# Patient Record
Sex: Female | Born: 1950 | Race: Black or African American | Hispanic: No | Marital: Married | Smoking: Former smoker
Health system: Southern US, Community
[De-identification: ages and names within clinical notes are randomized; demographics above are authoritative.]

## PROBLEM LIST (undated history)

## (undated) DIAGNOSIS — I10 Essential (primary) hypertension: Secondary | ICD-10-CM

## (undated) DIAGNOSIS — E785 Hyperlipidemia, unspecified: Secondary | ICD-10-CM

## (undated) DIAGNOSIS — F0390 Unspecified dementia without behavioral disturbance: Secondary | ICD-10-CM

## (undated) DIAGNOSIS — I639 Cerebral infarction, unspecified: Secondary | ICD-10-CM

## (undated) DIAGNOSIS — R1310 Dysphagia, unspecified: Secondary | ICD-10-CM

## (undated) HISTORY — DX: Unspecified dementia, unspecified severity, without behavioral disturbance, psychotic disturbance, mood disturbance, and anxiety: F03.90

## (undated) HISTORY — PX: NO PAST SURGERIES: SHX2092

## (undated) HISTORY — DX: Hyperlipidemia, unspecified: E78.5

## (undated) HISTORY — DX: Essential (primary) hypertension: I10

## (undated) HISTORY — DX: Cerebral infarction, unspecified: I63.9

---

## 2000-06-07 ENCOUNTER — Emergency Department (HOSPITAL_COMMUNITY): Admission: EM | Admit: 2000-06-07 | Discharge: 2000-06-07 | Payer: Self-pay | Admitting: Emergency Medicine

## 2001-07-09 ENCOUNTER — Other Ambulatory Visit: Admission: RE | Admit: 2001-07-09 | Discharge: 2001-07-09 | Payer: Self-pay | Admitting: Family Medicine

## 2001-07-23 ENCOUNTER — Ambulatory Visit (HOSPITAL_COMMUNITY): Admission: RE | Admit: 2001-07-23 | Discharge: 2001-07-23 | Payer: Self-pay | Admitting: Family Medicine

## 2002-01-20 ENCOUNTER — Encounter: Payer: Self-pay | Admitting: Emergency Medicine

## 2002-01-20 ENCOUNTER — Emergency Department (HOSPITAL_COMMUNITY): Admission: EM | Admit: 2002-01-20 | Discharge: 2002-01-20 | Payer: Self-pay | Admitting: *Deleted

## 2006-10-08 ENCOUNTER — Ambulatory Visit (HOSPITAL_COMMUNITY): Admission: RE | Admit: 2006-10-08 | Discharge: 2006-10-08 | Payer: Self-pay | Admitting: Family Medicine

## 2007-01-01 ENCOUNTER — Emergency Department (HOSPITAL_COMMUNITY): Admission: EM | Admit: 2007-01-01 | Discharge: 2007-01-01 | Payer: Self-pay | Admitting: Emergency Medicine

## 2007-04-04 ENCOUNTER — Emergency Department (HOSPITAL_COMMUNITY): Admission: EM | Admit: 2007-04-04 | Discharge: 2007-04-04 | Payer: Self-pay | Admitting: Emergency Medicine

## 2007-05-07 ENCOUNTER — Emergency Department (HOSPITAL_COMMUNITY): Admission: EM | Admit: 2007-05-07 | Discharge: 2007-05-08 | Payer: Self-pay | Admitting: Emergency Medicine

## 2008-02-11 ENCOUNTER — Emergency Department (HOSPITAL_COMMUNITY): Admission: EM | Admit: 2008-02-11 | Discharge: 2008-02-11 | Payer: Self-pay | Admitting: Emergency Medicine

## 2008-08-19 ENCOUNTER — Encounter: Admission: RE | Admit: 2008-08-19 | Discharge: 2008-08-19 | Payer: Self-pay | Admitting: Family Medicine

## 2008-11-28 ENCOUNTER — Encounter: Admission: RE | Admit: 2008-11-28 | Discharge: 2008-11-28 | Payer: Self-pay | Admitting: Family Medicine

## 2009-08-04 ENCOUNTER — Emergency Department (HOSPITAL_COMMUNITY): Admission: EM | Admit: 2009-08-04 | Discharge: 2009-08-04 | Payer: Self-pay | Admitting: Emergency Medicine

## 2010-05-18 ENCOUNTER — Encounter: Admission: RE | Admit: 2010-05-18 | Discharge: 2010-05-18 | Payer: Self-pay | Admitting: Family Medicine

## 2010-05-23 ENCOUNTER — Encounter
Admission: RE | Admit: 2010-05-23 | Discharge: 2010-07-11 | Payer: Self-pay | Source: Home / Self Care | Attending: Family Medicine | Admitting: Family Medicine

## 2010-08-11 ENCOUNTER — Encounter: Payer: Self-pay | Admitting: Family Medicine

## 2011-05-01 LAB — CBC
MCHC: 33.6
MCV: 82.5
Platelets: 257
RDW: 14.8 — ABNORMAL HIGH

## 2011-05-01 LAB — DIFFERENTIAL
Monocytes Relative: 7
Neutro Abs: 2.7
Neutrophils Relative %: 44

## 2011-05-09 LAB — URINALYSIS, ROUTINE W REFLEX MICROSCOPIC
Hgb urine dipstick: NEGATIVE
Ketones, ur: NEGATIVE
Nitrite: NEGATIVE
Protein, ur: NEGATIVE
Specific Gravity, Urine: 1.01
Urobilinogen, UA: 1

## 2011-05-09 LAB — URINE MICROSCOPIC-ADD ON

## 2011-05-09 LAB — OCCULT BLOOD X 1 CARD TO LAB, STOOL: Fecal Occult Bld: NEGATIVE

## 2011-05-09 LAB — BASIC METABOLIC PANEL
Calcium: 9.4
GFR calc non Af Amer: >60
Glucose, Bld: 104 — ABNORMAL HIGH
Potassium: 4.5
Sodium: 140

## 2011-05-09 LAB — CBC
HCT: 41.3
MCV: 80.9
Platelets: 282
RDW: 14.1 — ABNORMAL HIGH
WBC: 6.3

## 2012-03-03 ENCOUNTER — Ambulatory Visit: Payer: BC Managed Care – PPO

## 2012-03-10 ENCOUNTER — Ambulatory Visit: Payer: BC Managed Care – PPO | Attending: Urology

## 2012-03-10 DIAGNOSIS — IMO0001 Reserved for inherently not codable concepts without codable children: Secondary | ICD-10-CM | POA: Insufficient documentation

## 2012-03-10 DIAGNOSIS — I69920 Aphasia following unspecified cerebrovascular disease: Secondary | ICD-10-CM | POA: Insufficient documentation

## 2012-03-25 ENCOUNTER — Ambulatory Visit: Payer: BC Managed Care – PPO | Attending: Urology | Admitting: Speech Pathology

## 2012-03-25 DIAGNOSIS — I69920 Aphasia following unspecified cerebrovascular disease: Secondary | ICD-10-CM | POA: Insufficient documentation

## 2012-03-25 DIAGNOSIS — IMO0001 Reserved for inherently not codable concepts without codable children: Secondary | ICD-10-CM | POA: Insufficient documentation

## 2012-04-01 ENCOUNTER — Ambulatory Visit: Payer: BC Managed Care – PPO | Admitting: Speech Pathology

## 2012-04-07 ENCOUNTER — Ambulatory Visit: Payer: BC Managed Care – PPO

## 2012-04-10 ENCOUNTER — Ambulatory Visit: Payer: BC Managed Care – PPO | Admitting: Speech Pathology

## 2012-04-14 ENCOUNTER — Ambulatory Visit: Payer: BC Managed Care – PPO

## 2012-04-16 ENCOUNTER — Ambulatory Visit: Payer: BC Managed Care – PPO

## 2012-04-21 ENCOUNTER — Ambulatory Visit: Payer: BC Managed Care – PPO | Attending: Urology

## 2012-04-21 DIAGNOSIS — I69920 Aphasia following unspecified cerebrovascular disease: Secondary | ICD-10-CM | POA: Insufficient documentation

## 2012-04-21 DIAGNOSIS — IMO0001 Reserved for inherently not codable concepts without codable children: Secondary | ICD-10-CM | POA: Insufficient documentation

## 2012-04-23 ENCOUNTER — Ambulatory Visit: Payer: BC Managed Care – PPO

## 2012-04-28 ENCOUNTER — Ambulatory Visit: Payer: BC Managed Care – PPO

## 2012-04-30 ENCOUNTER — Ambulatory Visit: Payer: BC Managed Care – PPO

## 2012-05-04 ENCOUNTER — Encounter: Payer: BC Managed Care – PPO | Admitting: Speech Pathology

## 2012-05-06 ENCOUNTER — Ambulatory Visit: Payer: BC Managed Care – PPO | Admitting: Speech Pathology

## 2014-09-21 ENCOUNTER — Encounter: Payer: Self-pay | Admitting: Internal Medicine

## 2014-09-21 ENCOUNTER — Ambulatory Visit (INDEPENDENT_AMBULATORY_CARE_PROVIDER_SITE_OTHER): Payer: Medicare Other | Admitting: Internal Medicine

## 2014-09-21 VITALS — BP 177/86 | HR 86 | Temp 98.1°F | Ht 64.0 in | Wt 241.9 lb

## 2014-09-21 DIAGNOSIS — I10 Essential (primary) hypertension: Secondary | ICD-10-CM

## 2014-09-21 DIAGNOSIS — Z8673 Personal history of transient ischemic attack (TIA), and cerebral infarction without residual deficits: Secondary | ICD-10-CM

## 2014-09-21 DIAGNOSIS — I639 Cerebral infarction, unspecified: Secondary | ICD-10-CM | POA: Insufficient documentation

## 2014-09-21 DIAGNOSIS — Z1239 Encounter for other screening for malignant neoplasm of breast: Secondary | ICD-10-CM

## 2014-09-21 DIAGNOSIS — Z7689 Persons encountering health services in other specified circumstances: Secondary | ICD-10-CM | POA: Insufficient documentation

## 2014-09-21 DIAGNOSIS — Z808 Family history of malignant neoplasm of other organs or systems: Secondary | ICD-10-CM

## 2014-09-21 DIAGNOSIS — Z7189 Other specified counseling: Secondary | ICD-10-CM

## 2014-09-21 DIAGNOSIS — Z1231 Encounter for screening mammogram for malignant neoplasm of breast: Secondary | ICD-10-CM

## 2014-09-21 DIAGNOSIS — F0390 Unspecified dementia without behavioral disturbance: Secondary | ICD-10-CM | POA: Insufficient documentation

## 2014-09-21 DIAGNOSIS — Z809 Family history of malignant neoplasm, unspecified: Secondary | ICD-10-CM | POA: Insufficient documentation

## 2014-09-21 DIAGNOSIS — Z23 Encounter for immunization: Secondary | ICD-10-CM

## 2014-09-21 DIAGNOSIS — Z299 Encounter for prophylactic measures, unspecified: Secondary | ICD-10-CM

## 2014-09-21 DIAGNOSIS — R739 Hyperglycemia, unspecified: Secondary | ICD-10-CM

## 2014-09-21 DIAGNOSIS — Z418 Encounter for other procedures for purposes other than remedying health state: Secondary | ICD-10-CM

## 2014-09-21 DIAGNOSIS — R7309 Other abnormal glucose: Secondary | ICD-10-CM

## 2014-09-21 LAB — CBC
HCT: 42.9 % (ref 36.0–46.0)
HEMOGLOBIN: 14.2 g/dL (ref 12.0–15.0)
MCH: 26.1 pg (ref 26.0–34.0)
MCHC: 33.1 g/dL (ref 30.0–36.0)
MCV: 78.9 fL (ref 78.0–100.0)
MPV: 9.4 fL (ref 8.6–12.4)
PLATELETS: 295 10*3/uL (ref 150–400)
RBC: 5.44 MIL/uL — AB (ref 3.87–5.11)
RDW: 16 % — ABNORMAL HIGH (ref 11.5–15.5)
WBC: 6.8 10*3/uL (ref 4.0–10.5)

## 2014-09-21 LAB — T4, FREE: Free T4: 1.14 ng/dL (ref 0.80–1.80)

## 2014-09-21 LAB — GLUCOSE, CAPILLARY: GLUCOSE-CAPILLARY: 89 mg/dL (ref 70–99)

## 2014-09-21 LAB — POCT GLYCOSYLATED HEMOGLOBIN (HGB A1C): Hemoglobin A1C: 5.7

## 2014-09-21 LAB — TSH: TSH: 0.976 u[IU]/mL (ref 0.350–4.500)

## 2014-09-21 MED ORDER — ASPIRIN EC 81 MG PO TBEC: 81.0000 mg | DELAYED_RELEASE_TABLET | Freq: Every day | ORAL | Status: DC

## 2014-09-21 MED ORDER — ATORVASTATIN CALCIUM 20 MG PO TABS: 20.0000 mg | ORAL_TABLET | Freq: Every day | ORAL | Status: DC

## 2014-09-21 MED ORDER — HYDROCHLOROTHIAZIDE 25 MG PO TABS: 25.0000 mg | ORAL_TABLET | Freq: Every day | ORAL | Status: DC

## 2014-09-21 NOTE — Assessment & Plan Note (Addendum)
Per daughter, [facility] h/o and dementia in the past, maybe around '12. Per her daughter, she was on some medication for dementia, but we have no records of what the medication was and the family is unsure. On exam, she seems to have normal physical strength, but she has significant difficulty with cognition and following commands. Speech is nonfluent, and she seems unable to comprehend verbally or in writing or write out what she is thinking- global aphasia. The question is why, especially this young. Differential includes: new CVA, possible that the pituitary prominence has expanded, although she would likely have vision changes/loss, which does not seem to be the case. Normal pressure hdrocephalous could present with cognitive changes, but those changes are generally depressive symptoms and aphasia is thought to be less common, and would also expect gait abnormality, which she does not have. Depression can have neurological manifestations, but she does not appear depressed. Will check TSH and free T4 as thyroid dysfunction can have neurologic manifestations. Checking CMP to assess for other causes of her symptoms. She may be drinking, which can also result in alcohol induced dementia, but the daughter is unsure if her mother is drinking. Possible that she has advancing Alzheimer's disease, which is the source for her symptoms. Doubt dementia medication such as Aricept or Namenda would be beneficial if this is Alzheimer's given the advanced nature. However, Alzheimer's disease at such a young age is atypical and is generally genetic, and the patient has no know family history of the disease. With her global aphasia, encephalitis is a possibilty but less likely given the duration of her symptoms vs advanced degenerative dementia.  - [facility] - Referring to Neurology --> family requests to see different provider - Checking  CMP, CBC, TSH, free T4 - Will need to check B12 at next visit.  - Repeat MRI brain and pituitary as recommended in '11.

## 2014-09-21 NOTE — Assessment & Plan Note (Signed)
Mildly elevated serum glucose in the past. Checking A1c in the setting of possible stroke history and cognitive deficits.

## 2014-09-21 NOTE — Assessment & Plan Note (Addendum)
Questionable h/o CVA. Per daughter, family kept in the dark by the patient until she began to develop speaking difficulties in '12. MRI brain from 10/11 w/o mention of acute or remote infarct. [facility] her h/o CVA and possible demential, per daughter.  - [facility]  - Checking Lipid panel, A1c - Starting ASA 81mg  daily - Starting Lipitor 20mg  daily which should provide antiinflammatory benefit in addition to cholesterol benefits.

## 2014-09-21 NOTE — Progress Notes (Signed)
Internal Medicine Clinic Attending  Case discussed with Dr. Glenn soon after the resident saw the patient.  We reviewed the resident's history and exam and pertinent patient test results.  I agree with the assessment, diagnosis, and plan of care documented in the resident's note. 

## 2014-09-21 NOTE — Assessment & Plan Note (Signed)
Per daughter, pt with h/o HTN but she has not been seen by an MD in a number of years and is not on medication. Last labs were from '08 with normal electrolytes and renal function.  - Checking CMP  - Starting HCTZ 25mg  daily - F/u in 1 mo and will check BMP to assess renal function and electrolytes on the diuretic.

## 2014-09-21 NOTE — Progress Notes (Signed)
Patient ID: Amy Orr, female   DOB: 1951-01-02, 64 y.o.   MRN: [id number]  Subjective:   Patient ID: Amy Orr female   DOB: 1951-01-02 64 y.o.   MRN: [id number]  HPI: Ms.Amy Orr is a 64 y.o. 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   Her daughter is present with her today and is providing the medical history. Per the daughter, the pt had a stroke around 2010 or 2011, which the daughter states was not revealed to the family until around 2012 when her speech began to worsen and she started having comprehension problems. Per her daughter, she is now having trouble comprehending written words. Brain MRI from 04/2010 was ordered due to slurred speech and loss of coordination. Results without mention of stroke but mild atrophy and small vessel disease was noted. 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  She has seen a Neurologist since this all occurred and was diagnosed with some form of dementia and was placed on some type of dementia medication per daughter.   PMH: HTN CVA ?Dementia  SH: Tobacco abuse- previous smoker 20+ys ago, unknown amount, thought to be occasional use Possible EtOH use, pt nods yes but unable to quantify amount  FH: M- HTN, HLD D- unknown Siblings (10)- CVA, HTN, sister with breast ca Daughter- asthma, allergies Son- schizophrenia    No past medical history on file. No current outpatient prescriptions on file.   No current facility-administered medications for this visit.   No family history on file. History   Social History  . Marital Status: Married    Spouse Name: N/A  . Number of Children: N/A  . Years of Education: N/A   Social History Main Topics  . Smoking status: Former Games developermoker  . Smokeless tobacco: Not on file  . Alcohol Use: Not on file  . Drug Use: Not on file  . Sexual Activity: Not on file   Other Topics Concern  . None    Social History Narrative  . None   Review of Systems: ROS difficult to obtain due to the patient not speaking or able to write. A 12 point ROS was performed, and the only question she answered yes to was for headaches, that are over her right eye. Otherwise ROS was negative.   Objective:  Physical Exam: Filed Vitals:   09/21/14 1540  BP: 177/86  Pulse: 86  Temp: 98.1 F (36.7 C)  TempSrc: Oral  Height: 5\' 4"  (1.626 m)  Weight: 241 lb 14.4 oz (109.725 kg)  SpO2: 98%   Constitutional: Vital signs reviewed.  Patient is a well-developed and well-nourished female in no acute distress and cooperative with exam.  Head: Normocephalic and atraumatic Face: Hirsutism under chin Mouth: MMM Eyes: PERRL, EOMI appear intact, pt unable to follow commands.   Neck: Trachea midline, normal ROM, thyroid not enlarged Cardiovascular: RRR, no MRG, pulses symmetric and intact bilaterally Pulmonary/Chest: Sounds clear, poor effort Breast: Deferred Abdominal: Soft. Non-tender, non-distended, bowel sounds are normal, no masses, organomegaly, or guarding present.  GU: no CVA tenderness. Deferred vaginal exam. Musculoskeletal: No joint deformities, erythema, or stiffness, ROM full. Neurological: Pt alert. Unable to assess orientation since she is not speaking. Pt not able to follow commands except to stand and sit on and get off the exam table. Does not appear to have facial weakness or droop, unable to assess other CN or obtain adequate neurological exam.  Skin:  Warm, dry and intact.   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   Assessment & Plan:   Please refer to Problem List based Assessment and Plan

## 2014-09-21 NOTE — Patient Instructions (Addendum)
I will refer you to Neurology  Begin taking a daily 81mg  Aspirin, Lipitor 20mg  daily for cholesterol, and HCTZ 25mg  daily for your blood pressure.  I will put referrals in to GYN for a complete female exam, to GI for a colonoscopy, and a referral for a mammogram. You will be contacted regarding scheduling.   General Instructions:   Please bring your medicines with you each time you come to clinic.  Medicines may include prescription medications, over-the-counter medications, herbal remedies, eye drops, vitamins, or other pills.      Hydrochlorothiazide, HCTZ capsules or tablets What is this medicine? HYDROCHLOROTHIAZIDE (hye droe klor oh THYE a zide) is a diuretic. It increases the amount of urine passed, which causes the body to lose salt and water. This medicine is used to treat high blood pressure. It is also reduces the swelling and water retention caused by various medical conditions, such as heart, liver, or kidney disease. This medicine may be used for other purposes; ask your health care provider or pharmacist if you have questions. COMMON BRAND NAME(S): Esidrix, Ezide, HydroDIURIL, Microzide, Oretic, Zide What should I tell my health care provider before I take this medicine? They need to know if you have any of these conditions: -diabetes -gout -immune system problems, like lupus -kidney disease or kidney stones -liver disease -pancreatitis -small amount of urine or difficulty passing urine -an unusual or allergic reaction to hydrochlorothiazide, sulfa drugs, other medicines, foods, dyes, or preservatives -pregnant or trying to get pregnant -breast-feeding How should I use this medicine? Take this medicine by mouth with a glass of water. Follow the directions on the prescription label. Take your medicine at regular intervals. Remember that you will need to pass urine frequently after taking this medicine. Do not take your doses at a time of day that will cause you problems.  Do not stop taking your medicine unless your doctor tells you to. Talk to your pediatrician regarding the use of this medicine in children. Special care may be needed. Overdosage: If you think you have taken too much of this medicine contact a poison control center or emergency room at once. NOTE: This medicine is only for you. Do not share this medicine with others. What if I miss a dose? If you miss a dose, take it as soon as you can. If it is almost time for your next dose, take only that dose. Do not take double or extra doses. What may interact with this medicine? -cholestyramine -colestipol -digoxin -dofetilide -lithium -medicines for blood pressure -medicines for diabetes -medicines that relax muscles for surgery -other diuretics -steroid medicines like prednisone or cortisone This list may not describe all possible interactions. Give your health care provider a list of all the medicines, herbs, non-prescription drugs, or dietary supplements you use. Also tell them if you smoke, drink alcohol, or use illegal drugs. Some items may interact with your medicine. What should I watch for while using this medicine? Visit your doctor or health care professional for regular checks on your progress. Check your blood pressure as directed. Ask your doctor or health care professional what your blood pressure should be and when you should contact him or her. You may need to be on a special diet while taking this medicine. Ask your doctor. Check with your doctor or health care professional if you get an attack of severe diarrhea, nausea and vomiting, or if you sweat a lot. The loss of too much body fluid can make it dangerous for you  to take this medicine. You may get drowsy or dizzy. Do not drive, use machinery, or do anything that needs mental alertness until you know how this medicine affects you. Do not stand or sit up quickly, especially if you are an older patient. This reduces the risk of dizzy  or fainting spells. Alcohol may interfere with the effect of this medicine. Avoid alcoholic drinks. This medicine may affect your blood sugar level. If you have diabetes, check with your doctor or health care professional before changing the dose of your diabetic medicine. This medicine can make you more sensitive to the sun. Keep out of the sun. If you cannot avoid being in the sun, wear protective clothing and use sunscreen. Do not use sun lamps or tanning beds/booths. What side effects may I notice from receiving this medicine? Side effects that you should report to your doctor or health care professional as soon as possible: -allergic reactions such as skin rash or itching, hives, swelling of the lips, mouth, tongue, or throat -changes in vision -chest pain -eye pain -fast or irregular heartbeat -feeling faint or lightheaded, falls -gout attack -muscle pain or cramps -pain or difficulty when passing urine -pain, tingling, numbness in the hands or feet -redness, blistering, peeling or loosening of the skin, including inside the mouth -unusually weak or tired Side effects that usually do not require medical attention (report to your doctor or health care professional if they continue or are bothersome): -change in sex drive or performance -dry mouth -headache -stomach upset This list may not describe all possible side effects. Call your doctor for medical advice about side effects. You may report side effects to FDA at 1-800-FDA-1088. Where should I keep my medicine? Keep out of the reach of children. Store at room temperature between 15 and 30 degrees C (59 and 86 degrees F). Do not freeze. Protect from light and moisture. Keep container closed tightly. Throw away any unused medicine after the expiration date. NOTE: This sheet is a summary. It may not cover all possible information. If you have questions about this medicine, talk to your doctor, pharmacist, or health care provider.   2015, Elsevier/Gold Standard. (2010-03-02 12:57:37) Atorvastatin tablets What is this medicine? ATORVASTATIN (a TORE va sta tin) is known as a HMG-CoA reductase inhibitor or 'statin'. It lowers the level of cholesterol and triglycerides in the blood. This drug may also reduce the risk of heart attack, stroke, or other health problems in patients with risk factors for heart disease. Diet and lifestyle changes are often used with this drug. This medicine may be used for other purposes; ask your health care provider or pharmacist if you have questions. COMMON BRAND NAME(S): Lipitor What should I tell my health care provider before I take this medicine? They need to know if you have any of these conditions: -frequently drink alcoholic beverages -history of stroke, TIA -kidney disease -liver disease -muscle aches or weakness -other medical condition -an unusual or allergic reaction to atorvastatin, other medicines, foods, dyes, or preservatives -pregnant or trying to get pregnant -breast-feeding How should I use this medicine? Take this medicine by mouth with a glass of water. Follow the directions on the prescription label. You can take this medicine with or without food. Take your doses at regular intervals. Do not take your medicine more often than directed. Talk to your pediatrician regarding the use of this medicine in children. While this drug may be prescribed for children as young as 59 years old for selected conditions,  precautions do apply. Overdosage: If you think you have taken too much of this medicine contact a poison control center or emergency room at once. NOTE: This medicine is only for you. Do not share this medicine with others. What if I miss a dose? If you miss a dose, take it as soon as you can. If it is almost time for your next dose, take only that dose. Do not take double or extra doses. What may interact with this medicine? Do not take this medicine with any of the  following medications: -red yeast rice -telaprevir -telithromycin -voriconazole This medicine may also interact with the following medications: -alcohol -antiviral medicines for HIV or AIDS -boceprevir -certain antibiotics like clarithromycin, erythromycin, troleandomycin -certain medicines for cholesterol like fenofibrate or gemfibrozil -cimetidine -clarithromycin -colchicine -cyclosporine -digoxin -female hormones, like estrogens or progestins and birth control pills -grapefruit juice -medicines for fungal infections like fluconazole, itraconazole, ketoconazole -niacin -rifampin -spironolactone This list may not describe all possible interactions. Give your health care provider a list of all the medicines, herbs, non-prescription drugs, or dietary supplements you use. Also tell them if you smoke, drink alcohol, or use illegal drugs. Some items may interact with your medicine. What should I watch for while using this medicine? Visit your doctor or health care professional for regular check-ups. You may need regular tests to make sure your liver is working properly. Tell your doctor or health care professional right away if you get any unexplained muscle pain, tenderness, or weakness, especially if you also have a fever and tiredness. Your doctor or health care professional may tell you to stop taking this medicine if you develop muscle problems. If your muscle problems do not go away after stopping this medicine, contact your health care professional. This drug is only part of a total heart-health program. Your doctor or a dietician can suggest a low-cholesterol and low-fat diet to help. Avoid alcohol and smoking, and keep a proper exercise schedule. Do not use this drug if you are pregnant or breast-feeding. Serious side effects to an unborn child or to an infant are possible. Talk to your doctor or pharmacist for more information. This medicine may affect blood sugar levels. If you  have diabetes, check with your doctor or health care professional before you change your diet or the dose of your diabetic medicine. If you are going to have surgery tell your health care professional that you are taking this drug. What side effects may I notice from receiving this medicine? Side effects that you should report to your doctor or health care professional as soon as possible: -allergic reactions like skin rash, itching or hives, swelling of the face, lips, or tongue -dark urine -fever -joint pain -muscle cramps, pain -redness, blistering, peeling or loosening of the skin, including inside the mouth -trouble passing urine or change in the amount of urine -unusually weak or tired -yellowing of eyes or skin Side effects that usually do not require medical attention (report to your doctor or health care professional if they continue or are bothersome): -constipation -heartburn -stomach gas, pain, upset This list may not describe all possible side effects. Call your doctor for medical advice about side effects. You may report side effects to FDA at 1-800-FDA-1088. Where should I keep my medicine? Keep out of the reach of children. Store at room temperature between 20 to 25 degrees C (68 to 77 degrees F). Throw away any unused medicine after the expiration date. NOTE: This sheet is a summary. It  may not cover all possible information. If you have questions about this medicine, talk to your doctor, pharmacist, or health care provider.  2015, Elsevier/Gold Standard. (2011-05-28 40:98:1109:18:24)

## 2014-09-21 NOTE — Assessment & Plan Note (Signed)
Last labs were from '08, checking CBC, CMP, lipid panel, A1c.  Pt due for health maintenance items: referral placed to GYN for female exam, GI for colonoscopy, and for mammogram. Flu vaccine given in clinic today.  - Pt due for Tdap, shingles vaccine, and HIV screening, which can be addressed at her next clinic visit.

## 2014-09-22 LAB — LIPID PANEL
CHOLESTEROL: 218 mg/dL — AB (ref 0–200)
HDL: 69 mg/dL (ref >=46)
LDL CALC: 135 mg/dL — AB (ref 0–99)
TRIGLYCERIDES: 72 mg/dL (ref <150)
Total CHOL/HDL Ratio: 3.2 Ratio
VLDL: 14 mg/dL (ref 0–40)

## 2014-09-22 LAB — COMPLETE METABOLIC PANEL WITH GFR
ALBUMIN: 4.2 g/dL (ref 3.5–5.2)
ALT: 18 U/L (ref 0–35)
AST: 17 U/L (ref 0–37)
Alkaline Phosphatase: 82 U/L (ref 39–117)
BILIRUBIN TOTAL: 0.4 mg/dL (ref 0.2–1.2)
BUN: 6 mg/dL (ref 6–23)
CO2: 23 meq/L (ref 19–32)
Calcium: 9.2 mg/dL (ref 8.4–10.5)
Chloride: 107 mEq/L (ref 96–112)
Creat: 0.73 mg/dL (ref 0.50–1.10)
GFR, Est Non African American: 89 mL/min
Glucose, Bld: 88 mg/dL (ref 70–99)
POTASSIUM: 3.8 meq/L (ref 3.5–5.3)
SODIUM: 141 meq/L (ref 135–145)
TOTAL PROTEIN: 7.2 g/dL (ref 6.0–8.3)

## 2014-09-22 NOTE — Addendum Note (Signed)
Addended by: Genelle GatherGLENN, Lenus Trauger F on: 09/22/2014 09:11 AM   Modules accepted: Orders

## 2014-09-22 NOTE — Progress Notes (Signed)
Talked with daughter Dennie Bible- [place] [phone] - aware MRI [facility] [address] 09/28/14 7PM - arrive 6:45PM. Christabelle Hanzlik RN 09/22/14 9:20AM

## 2014-09-28 ENCOUNTER — Ambulatory Visit (HOSPITAL_COMMUNITY)
Admission: RE | Admit: 2014-09-28 | Discharge: 2014-09-28 | Disposition: A | Discharge status: Home / Self Care | Payer: Medicare Other | Source: Ambulatory Visit | Attending: Internal Medicine | Admitting: Internal Medicine

## 2014-09-28 DIAGNOSIS — F0390 Unspecified dementia without behavioral disturbance: Secondary | ICD-10-CM

## 2014-09-28 DIAGNOSIS — R410 Disorientation, unspecified: Secondary | ICD-10-CM | POA: Insufficient documentation

## 2014-09-28 DIAGNOSIS — R55 Syncope and collapse: Secondary | ICD-10-CM | POA: Diagnosis not present

## 2014-09-28 DIAGNOSIS — R251 Tremor, unspecified: Secondary | ICD-10-CM | POA: Diagnosis not present

## 2014-09-28 DIAGNOSIS — R479 Unspecified speech disturbances: Secondary | ICD-10-CM | POA: Insufficient documentation

## 2014-09-30 ENCOUNTER — Telehealth: Payer: Self-pay | Admitting: *Deleted

## 2014-09-30 NOTE — Telephone Encounter (Signed)
Pts daughter called about results MRI - aware Dr Sherrine MaplesGlenn unable to call till 10/03/14 - was told no new findings or changes per Dr Sherrine MaplesGlenn. Stanton KidneyDebra Ecko Beasley RN 09/30/14 3PM

## 2014-10-06 ENCOUNTER — Ambulatory Visit (HOSPITAL_COMMUNITY): Admission: RE | Admit: 2014-10-06 | Payer: Medicare Other | Source: Ambulatory Visit

## 2014-10-18 ENCOUNTER — Encounter: Payer: Self-pay | Admitting: *Deleted

## 2014-10-20 ENCOUNTER — Encounter: Payer: Self-pay | Admitting: Internal Medicine

## 2014-10-20 ENCOUNTER — Encounter: Payer: Medicare Other | Admitting: Obstetrics & Gynecology

## 2014-10-26 ENCOUNTER — Encounter: Payer: Medicare Other | Admitting: Obstetrics & Gynecology

## 2014-10-31 NOTE — Addendum Note (Signed)
Addended by: Neomia DearPOWERS, Telford Archambeau E on: 10/31/2014 06:34 PM   Modules accepted: Orders

## 2014-11-10 ENCOUNTER — Ambulatory Visit: Payer: Medicare Other | Admitting: Internal Medicine

## 2014-11-21 NOTE — Addendum Note (Signed)
Addended by: Neomia DearPOWERS, Pinkey Mcjunkin E on: 11/21/2014 06:29 PM   Modules accepted: Orders

## 2015-01-12 ENCOUNTER — Emergency Department (HOSPITAL_COMMUNITY): Payer: Medicare Other

## 2015-01-12 ENCOUNTER — Emergency Department (HOSPITAL_COMMUNITY)
Admission: EM | Admit: 2015-01-12 | Discharge: 2015-01-12 | Disposition: A | Discharge status: Home / Self Care | Payer: Medicare Other | Attending: Emergency Medicine | Admitting: Emergency Medicine

## 2015-01-12 ENCOUNTER — Encounter (HOSPITAL_COMMUNITY): Payer: Self-pay | Admitting: Emergency Medicine

## 2015-01-12 ENCOUNTER — Telehealth: Payer: Self-pay | Admitting: *Deleted

## 2015-01-12 DIAGNOSIS — Z8673 Personal history of transient ischemic attack (TIA), and cerebral infarction without residual deficits: Secondary | ICD-10-CM | POA: Insufficient documentation

## 2015-01-12 DIAGNOSIS — I10 Essential (primary) hypertension: Secondary | ICD-10-CM | POA: Diagnosis not present

## 2015-01-12 DIAGNOSIS — R4182 Altered mental status, unspecified: Secondary | ICD-10-CM | POA: Diagnosis present

## 2015-01-12 DIAGNOSIS — Z79899 Other long term (current) drug therapy: Secondary | ICD-10-CM | POA: Diagnosis not present

## 2015-01-12 DIAGNOSIS — Z87891 Personal history of nicotine dependence: Secondary | ICD-10-CM | POA: Insufficient documentation

## 2015-01-12 DIAGNOSIS — F0390 Unspecified dementia without behavioral disturbance: Secondary | ICD-10-CM | POA: Insufficient documentation

## 2015-01-12 LAB — CBC WITH DIFFERENTIAL/PLATELET
BASOS ABS: 0 10*3/uL (ref 0.0–0.1)
BASOS PCT: 0 % (ref 0–1)
Eosinophils Absolute: 0 10*3/uL (ref 0.0–0.7)
Eosinophils Relative: 0 % (ref 0–5)
HCT: 39.1 % (ref 36.0–46.0)
HEMOGLOBIN: 13 g/dL (ref 12.0–15.0)
LYMPHS PCT: 24 % (ref 12–46)
Lymphs Abs: 1.5 10*3/uL (ref 0.7–4.0)
MCH: 26.7 pg (ref 26.0–34.0)
MCHC: 33.2 g/dL (ref 30.0–36.0)
MCV: 80.3 fL (ref 78.0–100.0)
MONO ABS: 0.7 10*3/uL (ref 0.1–1.0)
Monocytes Relative: 11 % (ref 3–12)
NEUTROS ABS: 4 10*3/uL (ref 1.7–7.7)
NEUTROS PCT: 65 % (ref 43–77)
PLATELETS: 257 10*3/uL (ref 150–400)
RBC: 4.87 MIL/uL (ref 3.87–5.11)
RDW: 15.7 % — AB (ref 11.5–15.5)
WBC: 6.2 10*3/uL (ref 4.0–10.5)

## 2015-01-12 LAB — URINALYSIS, ROUTINE W REFLEX MICROSCOPIC
GLUCOSE, UA: NEGATIVE mg/dL
HGB URINE DIPSTICK: NEGATIVE
Ketones, ur: 40 mg/dL — AB
Leukocytes, UA: NEGATIVE
Nitrite: NEGATIVE
PROTEIN: NEGATIVE mg/dL
Specific Gravity, Urine: 1.029 (ref 1.005–1.030)
UROBILINOGEN UA: 1 mg/dL (ref 0.0–1.0)
pH: 6 (ref 5.0–8.0)

## 2015-01-12 LAB — COMPREHENSIVE METABOLIC PANEL
ALBUMIN: 3.7 g/dL (ref 3.5–5.0)
ALT: 54 U/L (ref 14–54)
AST: 64 U/L — AB (ref 15–41)
Alkaline Phosphatase: 68 U/L (ref 38–126)
Anion gap: 8 (ref 5–15)
CALCIUM: 9.1 mg/dL (ref 8.9–10.3)
CO2: 25 mmol/L (ref 22–32)
Chloride: 108 mmol/L (ref 101–111)
Creatinine, Ser: 0.86 mg/dL (ref 0.44–1.00)
GFR calc Af Amer: >60 mL/min (ref >60)
GFR calc non Af Amer: >60 mL/min (ref >60)
GLUCOSE: 127 mg/dL — AB (ref 65–99)
Potassium: 3.1 mmol/L — ABNORMAL LOW (ref 3.5–5.1)
SODIUM: 141 mmol/L (ref 135–145)
Total Bilirubin: 0.4 mg/dL (ref 0.3–1.2)
Total Protein: 7.2 g/dL (ref 6.5–8.1)

## 2015-01-12 NOTE — ED Notes (Signed)
Pt arrived from home with husband and son by [facility] with c/o increased confusion and repetitive movements. Pts husband is currently admitted to the hospital. Daughter is at bedside stated that pt has dementia and does not talk normally since pt had a stroke 6 years ago. Pt started having repetitive movements that are not normal for her such as filling up a bucket of water and pouring it into the toilet over and over again. Pt also kept playing an ad on her tablet over and over again as well. Pt husband told the daughter recently that pt has been having increase frequency of urination as well. BP-150/85 HR-90 NSR Resp-18 O2sat-99%ra CBG-87

## 2015-01-12 NOTE — Discharge Instructions (Signed)
She may be a little dehydrated. Encourage oral intake.  Dementia Dementia is a general term for problems with brain function. A person with dementia has memory loss and a hard time with at least one other brain function such as thinking, speaking, or problem solving. Dementia can affect social functioning, how you do your job, your mood, or your personality. The changes may be hidden for a long time. The earliest forms of this disease are usually not detected by family or friends. Dementia can be:  Irreversible.  Potentially reversible.  Partially reversible.  Progressive. This means it can get worse over time. CAUSES  Irreversible dementia causes may include:  Degeneration of brain cells (Alzheimer disease or Lewy body dementia).  Multiple small strokes (vascular dementia).  Infection (chronic meningitis or Creutzfeldt-Jakob disease).  Frontotemporal dementia. This affects younger people, age 31 to 37, compared to those who have Alzheimer disease.  Dementia associated with other disorders like Parkinson disease, Huntington disease, or HIV-associated dementia. Potentially or partially reversible dementia causes may include:  Medicines.  Metabolic causes such as excessive alcohol intake, vitamin B12 deficiency, or thyroid disease.  Masses or pressure in the brain such as a tumor, blood clot, or hydrocephalus. SIGNS AND SYMPTOMS  Symptoms are often hard to detect. Family members or coworkers may not notice them early in the disease process. Different people with dementia may have different symptoms. Symptoms can include:  A hard time with memory, especially recent memory. Long-term memory may not be impaired.  Asking the same question multiple times or forgetting something someone just said.  A hard time speaking your thoughts or finding certain words.  A hard time solving problems or performing familiar tasks (such as how to use a telephone).  Sudden changes in  mood.  Changes in personality, especially increasing moodiness or mistrust.  Depression.  A hard time understanding complex ideas that were never a problem in the past. DIAGNOSIS  There are no specific tests for dementia.   Your health care provider may recommend a thorough evaluation. This is because some forms of dementia can be reversible. The evaluation will likely include a physical exam and getting a detailed history from you and a family member. The history often gives the best clues and suggestions for a diagnosis.  Memory testing may be done. A detailed brain function evaluation called neuropsychologic testing may be helpful.  Lab tests and brain imaging (such as a CT scan or MRI scan) are sometimes important.  Sometimes observation and re-evaluation over time is very helpful. TREATMENT  Treatment depends on the cause.   If the problem is a vitamin deficiency, it may be helped or cured with supplements.  For dementias such as Alzheimer disease, medicines are available to stabilize or slow the course of the disease. There are no cures for this type of dementia.  Your health care provider can help direct you to groups, organizations, and other health care providers to help with decisions in the care of you or your loved one. HOME CARE INSTRUCTIONS The care of individuals with dementia is varied and dependent upon the progression of the dementia. The following suggestions are intended for the person living with, or caring for, the person with dementia.  Create a safe environment.  Remove the locks on bathroom doors to prevent the person from accidentally locking himself or herself in.  Use childproof latches on kitchen cabinets and any place where cleaning supplies, chemicals, or alcohol are kept.  Use childproof covers in unused electrical  outlets.  Install childproof devices to keep doors and windows secured.  Remove stove knobs or install safety knobs and an automatic  shut-off on the stove.  Lower the temperature on water heaters.  Label medicines and keep them locked up.  Secure knives, lighters, matches, power tools, and guns, and keep these items out of reach.  Keep the house free from clutter. Remove rugs or anything that might contribute to a fall.  Remove objects that might break and hurt the person.  Make sure lighting is good, both inside and outside.  Install grab rails as needed.  Use a monitoring device to alert you to falls or other needs for help.  Reduce confusion.  Keep familiar objects and people around.  Use night lights or dim lights at night.  Label items or areas.  Use reminders, notes, or directions for daily activities or tasks.  Keep a simple, consistent routine for waking, meals, bathing, dressing, and bedtime.  Create a calm, quiet environment.  Place large clocks and calendars prominently.  Display emergency numbers and home address near all telephones.  Use cues to establish different times of the day. An example is to open curtains to let the natural light in during the day.   Use effective communication.  Choose simple words and short sentences.  Use a gentle, calm tone of voice.  Be careful not to interrupt.  If the person is struggling to find a word or communicate a thought, try to provide the word or thought.  Ask one question at a time. Allow the person ample time to answer questions. Repeat the question again if the person does not respond.  Reduce nighttime restlessness.  Provide a comfortable bed.  Have a consistent nighttime routine.  Ensure a regular walking or physical activity schedule. Involve the person in daily activities as much as possible.  Limit napping during the day.  Limit caffeine.  Attend social events that stimulate rather than overwhelm the senses.  Encourage good nutrition and hydration.  Reduce distractions during meal times and snacks.  Avoid foods that  are too hot or too cold.  Monitor chewing and swallowing ability.  Continue with routine vision, hearing, dental, and medical screenings.  Give medicines only as directed by the health care provider.  Monitor driving abilities. Do not allow the person to drive when safe driving is no longer possible.  Register with an identification program which could provide location assistance in the event of a missing person situation. SEEK MEDICAL CARE IF:   New behavioral problems start such as moodiness, aggressiveness, or seeing things that are not there (hallucinations).  Any new problem with brain function happens. This includes problems with balance, speech, or falling a lot.  Problems with swallowing develop.  Any symptoms of other illness happen. Small changes or worsening in any aspect of brain function can be a sign that the illness is getting worse. It can also be a sign of another medical illness such as infection. Seeing a health care provider right away is important. SEEK IMMEDIATE MEDICAL CARE IF:   A fever develops.  New or worsened confusion develops.  New or worsened sleepiness develops.  Staying awake becomes hard to do. Document Released: 01/01/2001 Document Revised: 11/22/2013 Document Reviewed: 12/03/2010 [facility] Patient Information 2015 [place], [place]. This information is not intended to replace advice given to you by your health care provider. Make sure you discuss any questions you have with your health care provider.

## 2015-01-12 NOTE — ED Provider Notes (Signed)
CSN: [id number]     Arrival date & time 01/12/15  1454 History   First MD Initiated Contact with Patient 01/12/15 1559     Chief Complaint  Patient presents with  . Altered Mental Status    Level V caveat due to altered mental status.  Patient is a 64 y.o. female presenting with altered mental status.  Altered Mental Status  patient presents with altered mental status. Baseline is somewhat aphasic and confused due to previous stroke per family. Review of MRIs does not show stroke. Patient has been more confused over last few days. Has been a little more angry 2. Has been doing some repetitive motions such as filling the toilet tank with water and then flushing. She would do this repeatedly. Also has been watching the same video on a phone over over again. May have had some change in her urination.  Past Medical History  Diagnosis Date  . Stroke 2010 or 2011    Per family, but not documented on MRI brain 10/11  . Hypertension   . Dementia     Previously treated by Neurology   History reviewed. No pertinent past surgical history. Family History  Problem Relation Age of Onset  . Hyperlipidemia Mother   . Hypertension Mother   . Cancer Sister   . Hypertension Sister   . Stroke Brother   . Mental illness Son    History  Substance Use Topics  . Smoking status: Former Games developer  . Smokeless tobacco: Not on file  . Alcohol Use: 0.0 oz/week    0 Standard drinks or equivalent per week   OB History    No data available     Review of Systems  Unable to perform ROS     Allergies  Review of patient's allergies indicates no known allergies.  Home Medications   Prior to Admission medications   Medication Sig Start Date End Date Taking? Authorizing Provider  aspirin EC 81 MG tablet Take 1 tablet (81 mg total) by mouth daily. 09/21/14 09/21/15 Yes Genelle Gather, MD  atorvastatin (LIPITOR) 20 MG tablet Take 1 tablet (20 mg total) by mouth daily. 09/21/14 09/21/15 Yes Genelle Gather, MD   hydrochlorothiazide (HYDRODIURIL) 25 MG tablet Take 1 tablet (25 mg total) by mouth daily. 09/21/14  Yes Genelle Gather, MD   BP 126/75 mmHg  Pulse 68  Temp(Src) 98.5 F (36.9 C) (Oral)  Resp 14  SpO2 99% Physical Exam  Constitutional: She appears well-developed and well-nourished.  Eyes: EOM are normal.  Neck: Neck supple.  Cardiovascular: Normal rate.   Pulmonary/Chest: Effort normal.  Abdominal: Soft. There is no tenderness.  Musculoskeletal: Normal range of motion.  Neurological: She is alert.  Patient is basically nonverbal. Awakened smiling. Would squeeze my hand on the right but will not follow commands on the left. Patient's daughter states this is near her baseline now.  Skin: Skin is warm.    ED Course  Procedures (including critical care time) Labs Review Labs Reviewed  COMPREHENSIVE METABOLIC PANEL - Abnormal; Notable for the following:    Potassium 3.1 (*)    Glucose, Bld 127 (*)    BUN <5 (*)    AST 64 (*)    All other components within normal limits  URINALYSIS, ROUTINE W REFLEX MICROSCOPIC (NOT AT [facility]) - Abnormal; Notable for the following:    Color, Urine AMBER (*)    Bilirubin Urine SMALL (*)    Ketones, ur 40 (*)    All other  components within normal limits  CBC WITH DIFFERENTIAL/PLATELET - Abnormal; Notable for the following:    RDW 15.7 (*)    All other components within normal limits    Imaging Review Dg Chest 2 View  01/12/2015   CLINICAL DATA:  Altered mental status/confusion.  EXAM: CHEST  2 VIEW  COMPARISON:  None.  FINDINGS: The heart is enlarged but stable. There is tortuosity of the thoracic aorta. The lungs are grossly clear. No pleural effusion. The bony thorax is intact.  IMPRESSION: Cardiac enlargement but no acute pulmonary findings.   Electronically Signed   By: Rudie Meyer M.D.   On: 01/12/2015 16:58     EKG Interpretation   Date/Time:  Thursday January 12 2015 15:06:15 EDT Ventricular Rate:  82 PR Interval:  132 QRS Duration:  79 QT Interval:  370 QTC Calculation: 432 R Axis:   -10 Text Interpretation:  Sinus rhythm Borderline T abnormalities, diffuse  leads No old tracing to compare Confirmed by [facility]  MD, ELLIOTT [phone]) on  01/12/2015 3:18:12 PM      MDM   Final diagnoses:  Dementia, without behavioral disturbance    Patient with altered mental status. Likely worsening dementia. Lab work otherwise reassuring. Will discharge home.    Benjiman Core, MD 01/14/15 [phone]

## 2015-01-12 NOTE — Telephone Encounter (Signed)
Someone identified as pt's daughter has called from [phone], she did not state the problem or need, have called twice no answer, called home# cannot leaves message

## 2015-01-18 ENCOUNTER — Ambulatory Visit (INDEPENDENT_AMBULATORY_CARE_PROVIDER_SITE_OTHER): Payer: Medicare Other | Admitting: Internal Medicine

## 2015-01-18 ENCOUNTER — Encounter: Payer: Self-pay | Admitting: Internal Medicine

## 2015-01-18 VITALS — BP 144/72 | HR 74 | Temp 99.2°F | Wt 234.8 lb

## 2015-01-18 DIAGNOSIS — Z8673 Personal history of transient ischemic attack (TIA), and cerebral infarction without residual deficits: Secondary | ICD-10-CM

## 2015-01-18 DIAGNOSIS — I10 Essential (primary) hypertension: Secondary | ICD-10-CM

## 2015-01-18 DIAGNOSIS — F0390 Unspecified dementia without behavioral disturbance: Secondary | ICD-10-CM

## 2015-01-18 MED ORDER — ASPIRIN EC 81 MG PO TBEC: 81.0000 mg | DELAYED_RELEASE_TABLET | Freq: Every day | ORAL | Status: AC

## 2015-01-18 MED ORDER — HYDROCHLOROTHIAZIDE 25 MG PO TABS: 25.0000 mg | ORAL_TABLET | Freq: Every day | ORAL | Status: AC

## 2015-01-18 MED ORDER — ATORVASTATIN CALCIUM 20 MG PO TABS: 20.0000 mg | ORAL_TABLET | Freq: Every day | ORAL | Status: AC

## 2015-01-18 NOTE — Patient Instructions (Signed)
1. Please schedule a follow up appointment for 3 months.   2. Please take all medications as previously prescribed with the following changes:  Continue Aspirin, Lipitor, and HCTZ daily, or as patient will tolerate.   The Social Worker, Lynnae JanuaryShana Grady, will get in touch with you about home health needs.   3. If you have worsening of your symptoms or new symptoms arise, please call the clinic [phone]), or go to the ER immediately if symptoms are severe.

## 2015-01-18 NOTE — Progress Notes (Signed)
   Subjective:   Patient ID: Audelia HivesDoreathy Kanouse female   DOB: 09-16-1950 64 y.o.   MRN: [id number]  HPI: Ms. Audelia HivesDoreathy Nitsch is a 64 y.o. female w/ PMHx of HTN, h/o CVA, and dementia, presents to the clinic today for an acute visit for worsening dementia. Patient recently taken to the ED by daughter for new repetitive movements and agitation. Patient seems to be at her baseline today, very pleasantly demented, accompanied by her daughter. Smiling a lot. No agitation, not able to answer questions. Daughter claims she is back to her baseline at this time, however, is concerned that her dementia is getting worse. Seems to be slightly overwhelmed w/ her care at this time. She also states that it is sometimes difficult to get her to take her medications. Patient seen in 09/2014 and had an extensive workup for stroke, etc, which was generally negative. MRI was significant for microvascular changes at that time.    Current Outpatient Prescriptions  Medication Sig Dispense Refill  . aspirin EC 81 MG tablet Take 1 tablet (81 mg total) by mouth daily. 150 tablet 2  . atorvastatin (LIPITOR) 20 MG tablet Take 1 tablet (20 mg total) by mouth daily. 30 tablet 5  . hydrochlorothiazide (HYDRODIURIL) 25 MG tablet Take 1 tablet (25 mg total) by mouth daily. 30 tablet 5   No current facility-administered medications for this visit.   Review of Systems (taken from daughter)  General: Denies fever, diaphoresis, appetite change, and fatigue.  Respiratory: Denies SOB, cough, and wheezing.   Cardiovascular: Denies chest pain and palpitations.  Gastrointestinal: Denies nausea, vomiting, abdominal pain, and diarrhea Musculoskeletal: Denies myalgias, arthralgias, back pain, and gait problem.  Neurological: Denies dizziness, syncope, weakness, lightheadedness, and headaches.  Psychiatric/Behavioral: Positive for mood changes and agitation. Denies sleep disturbance.    Objective:   Physical Exam: Filed Vitals:   01/18/15 1604  BP: 144/72  Pulse: 74  Temp: 99.2 F (37.3 C)  TempSrc: Oral  Weight: 234 lb 12.8 oz (106.505 kg)  SpO2: 100%    General: AA female, appears significantly older than stated age. Alert, cooperative, NAD. HEENT: PERRL, EOMI. Moist mucus membranes Neck: Full range of motion without pain, supple, no lymphadenopathy or carotid bruits Lungs: Clear to ascultation bilaterally, normal work of respiration, no wheezes, rales, rhonchi Heart: RRR, no murmurs, gallops, or rubs Abdomen: Soft, non-tender, non-distended, BS + Extremities: No cyanosis, clubbing, or edema Neurologic: Alert,, cranial nerves II-XII intact, strength grossly intact. Unable to answer questions, aphasic. Only follows some commands.    Assessment & Plan:   Please see problem based assessment and plan.

## 2015-01-19 ENCOUNTER — Telehealth: Payer: Self-pay | Admitting: Licensed Clinical Social Worker

## 2015-01-19 NOTE — Telephone Encounter (Signed)
Ms. Verdell CarmineHarper's daugther, Hillary BowYasheka Seawright was referred to CSW to obtain community resources on behalf of patient.  Ms. Amy Orr lives with adult son and spouse (still married but consider themselves no longer "together").  Daughter voiced concern as Ms. Amy Orr frequently forgets to take her medications and has difficulty preparing her meals when family is unable.  Pt showing medicare only.  [facility].   CSW discussed private pay programs: adult day care, sitter services, medical alert med reminder and family support groups.  Daughter aware CSW will mail items next business day for CSW.  Resources will be mailed to daughter's address.  Erin FullingYasheka inquired about services available if approved for Medicaid and PCS was discussed as well.

## 2015-01-23 NOTE — Assessment & Plan Note (Signed)
BP Readings from Last 3 Encounters:  01/18/15 144/72  01/12/15 126/75  09/21/14 177/86    Lab Results  Component Value Date   NA 141 01/12/2015   K 3.1* 01/12/2015   CREATININE 0.86 01/12/2015    Assessment: Blood pressure control:  Mild elevation Comments: Patient intermittently takes her meds per the daughter.   Plan: Medications:  continue current medications; HCTZ 25 mg daily when patient is willing to take.  Other plans: RTC in 3 months

## 2015-01-23 NOTE — Assessment & Plan Note (Signed)
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 Daughter does not seem to think this runs in her family. Recent agitation and repetitive movements. MOst likely related to mild worsening in dementia. Discussed further assistance at home w/ patient. -[facility], [facility], and possibilities of assisted living, etc.

## 2015-01-25 NOTE — Progress Notes (Signed)
Internal Medicine Clinic Attending  Case discussed with Dr. Jones soon after the resident saw the patient.  We reviewed the resident's history and exam and pertinent patient test results.  I agree with the assessment, diagnosis, and plan of care documented in the resident's note. 

## 2015-01-26 NOTE — Telephone Encounter (Signed)
Letter mailed

## 2015-02-22 ENCOUNTER — Encounter: Payer: Self-pay | Admitting: Internal Medicine

## 2015-02-22 ENCOUNTER — Ambulatory Visit (INDEPENDENT_AMBULATORY_CARE_PROVIDER_SITE_OTHER): Payer: Medicare Other | Admitting: Internal Medicine

## 2015-02-22 VITALS — BP 133/64 | HR 67 | Temp 98.1°F | Wt 228.2 lb

## 2015-02-22 DIAGNOSIS — F0390 Unspecified dementia without behavioral disturbance: Secondary | ICD-10-CM

## 2015-02-22 DIAGNOSIS — Z0000 Encounter for general adult medical examination without abnormal findings: Secondary | ICD-10-CM

## 2015-02-22 DIAGNOSIS — I10 Essential (primary) hypertension: Secondary | ICD-10-CM | POA: Diagnosis not present

## 2015-02-22 DIAGNOSIS — N898 Other specified noninflammatory disorders of vagina: Secondary | ICD-10-CM | POA: Insufficient documentation

## 2015-02-22 DIAGNOSIS — Z8673 Personal history of transient ischemic attack (TIA), and cerebral infarction without residual deficits: Secondary | ICD-10-CM

## 2015-02-22 DIAGNOSIS — Z7982 Long term (current) use of aspirin: Secondary | ICD-10-CM

## 2015-02-22 LAB — POCT URINALYSIS DIPSTICK
Bilirubin, UA: NEGATIVE
Glucose, UA: NEGATIVE
KETONES UA: NEGATIVE
Nitrite, UA: NEGATIVE
RBC UA: NEGATIVE
Spec Grav, UA: 1.02
Urobilinogen, UA: 2
pH, UA: 7

## 2015-02-22 NOTE — Progress Notes (Signed)
Subjective:   Patient ID: Amy Orr female    DOB: 1951/06/14 64 y.o.    MRN: [id number] Health Maintenance Due: Health Maintenance Due  Topic Date Due  . HIV Screening  11/09/1967  . PAP SMEAR  11/09/1970  . TETANUS/TDAP  11/09/1971  . COLONOSCOPY  11/09/2002  . MAMMOGRAM  10/07/2008  . ZOSTAVAX  11/08/2012  . INFLUENZA VACCINE  02/20/2015    _________________________________________________  HPI: Amy Orr is a 65 y.o. female here for an acute visit.  Pt has a PMH outlined below.  Please see problem-based charting assessment and plan note for further details of medical issues addressed at today's visit.  PMH: Past Medical History  Diagnosis Date  . Stroke 2010 or 2011    Per family, but not documented on MRI brain 10/11  . Hypertension   . Dementia     Previously treated by Neurology    Medications: Current Outpatient Prescriptions on File Prior to Visit  Medication Sig Dispense Refill  . aspirin EC 81 MG tablet Take 1 tablet (81 mg total) by mouth daily. 150 tablet 2  . atorvastatin (LIPITOR) 20 MG tablet Take 1 tablet (20 mg total) by mouth daily. 30 tablet 5  . hydrochlorothiazide (HYDRODIURIL) 25 MG tablet Take 1 tablet (25 mg total) by mouth daily. 30 tablet 5   No current facility-administered medications on file prior to visit.    Allergies: No Known Allergies  FH: Family History  Problem Relation Age of Onset  . Hyperlipidemia Mother   . Hypertension Mother   . Cancer Sister   . Hypertension Sister   . Stroke Brother   . Mental illness Son     SH: History   Social History  . Marital Status: Married    Spouse Name: N/A  . Number of Children: N/A  . Years of Education: N/A   Social History Main Topics  . Smoking status: Former Games developer  . Smokeless tobacco: Not on file  . Alcohol Use: 0.0 oz/week    0 Standard drinks or equivalent per week  . Drug Use: No  . Sexual Activity: Not on file   Other Topics Concern  . None     Social History Narrative    Review of Systems: Constitutional: Negative for fever, chills and weight loss.  Eyes: Negative for blurred vision.  Respiratory: Negative for cough and shortness of breath.  Cardiovascular: Negative for chest pain, palpitations and leg swelling.  Gastrointestinal: Negative for nausea, vomiting, abdominal pain, diarrhea, constipation and blood in stool.  Genitourinary: Negative for dysuria, urgency and +frequency.  Musculoskeletal: Negative for myalgias and back pain.  Neurological: Negative for dizziness, weakness and headaches.     Objective:   Vital Signs: Filed Vitals:   02/22/15 0857  BP: 133/64  Pulse: 67  Temp: 98.1 F (36.7 C)  TempSrc: Oral  Weight: 228 lb 3.2 oz (103.511 kg)  SpO2: 100%      BP Readings from Last 3 Encounters:  02/22/15 133/64  01/18/15 144/72  01/12/15 126/75    Physical Exam: Constitutional: Vital signs reviewed.  Patient appears in NAD and cooperation somewhat limited due to cognition.    Head: Normocephalic and atraumatic. Eyes: EOMI, conjunctivae nl, no scleral icterus.  Neck: Supple. Cardiovascular: RRR, no MRG. Pulmonary/Chest: normal effort, CTAB, no wheezes, rales, or rhonchi. Abdominal: Obese. Soft. NT/ND +BS. Neurological: Alert but not oriented, nonverbal, cranial nerves II-XII are grossly intact, moving all extremities. Genital: No external lesions, vaginal mucosa pink without discharge.  Extremities: 2+DP b/l; no pitting edema. Skin: Warm, dry and intact.    Assessment & Plan:   Assessment and plan was discussed and formulated with my attending.

## 2015-02-22 NOTE — Assessment & Plan Note (Addendum)
Daughter accompanies patient today d/t pt having dementia of unknown etiology being referred to neurology.  Daughter is unable to provide many details because she does not live with her but states that her father told her that her mother has been having increased urination and vaginal discharge.  On examination, I did not appreciate any vaginal discharge.  The vaginal mucosa was moist and pink.  Denies fever/chills or other symptoms except those noted previously. -obtain urine sample for UA/microscopic

## 2015-02-22 NOTE — Patient Instructions (Signed)
Thank you for your visit today.   Please keep your appointment with neurology.    I will check your urine and also some lab studies today.    Continue current medications.     Please be sure to bring all of your medications with you to every visit; this includes herbal supplements, vitamins, eye drops, and any over-the-counter medications.   Should you have any questions regarding your medications and/or any new or worsening symptoms, please be sure to call the clinic at [phone].   If you believe that you are suffering from a life threatening condition or one that may result in the loss of limb or function, then you should call 911 or proceed to the nearest Emergency Department.   A healthy lifestyle and preventative care can promote health and wellness.   Maintain regular health, dental, and eye exams.  Eat a healthy diet. Foods like vegetables, fruits, whole grains, low-fat dairy products, and lean protein foods contain the nutrients you need without too many calories. Decrease your intake of foods high in solid fats, added sugars, and salt. Get information about a proper diet from your caregiver, if necessary.  Regular physical exercise is one of the most important things you can do for your health. Most adults should get at least 150 minutes of moderate-intensity exercise (any activity that increases your heart rate and causes you to sweat) each week. In addition, most adults need muscle-strengthening exercises on 2 or more days a week.   Maintain a healthy weight. The body mass index (BMI) is a screening tool to identify possible weight problems. It provides an estimate of body fat based on height and weight. Your caregiver can help determine your BMI, and can help you achieve or maintain a healthy weight. For adults 20 years and older:  A BMI below 18.5 is considered underweight.  A BMI of 18.5 to 24.9 is normal.  A BMI of 25 to 29.9 is considered overweight.  A BMI of 30  and above is considered obese.

## 2015-02-22 NOTE — Assessment & Plan Note (Addendum)
Daughter who accompanies her mother (the patient) states pt dementia has been going on for about 2 years but has gotten progressively worse.  Thought to be d/t vascular dementia as pt has experienced a stroke in the past.  Pt is basically nonverbal and denies complaints.   -has appt with neuro 03/08/2015 (Dr. Karel Jarvis) -check RPR, B12  -apparently Edson Snowball has been contacted and may be able to attend [facility]; pt and caregivers are going to need a lot of support so any assistance we can help with, we will   -would consider aricept and namenda

## 2015-02-23 DIAGNOSIS — Z0000 Encounter for general adult medical examination without abnormal findings: Secondary | ICD-10-CM | POA: Insufficient documentation

## 2015-02-23 LAB — URINALYSIS, ROUTINE W REFLEX MICROSCOPIC
BILIRUBIN UA: NEGATIVE
Glucose, UA: NEGATIVE
Ketones, UA: NEGATIVE
Leukocytes, UA: NEGATIVE
Nitrite, UA: NEGATIVE
Protein, UA: NEGATIVE
RBC, UA: NEGATIVE
Specific Gravity, UA: 1.017 (ref 1.005–1.030)
UUROB: 1 mg/dL (ref 0.2–1.0)
pH, UA: 7.5 (ref 5.0–7.5)

## 2015-02-23 LAB — VITAMIN B12: VITAMIN B 12: 618 pg/mL (ref 211–946)

## 2015-02-23 LAB — RPR: RPR Ser Ql: NONREACTIVE

## 2015-02-23 NOTE — Assessment & Plan Note (Signed)
Pt is due for several health maintenance items, but I am not sure these are warranted given the patient current level of functioning and likely progressive decline.

## 2015-02-23 NOTE — Assessment & Plan Note (Signed)
Controlled. -cont current meds 

## 2015-02-24 NOTE — Progress Notes (Signed)
Internal Medicine Clinic Attending  Case discussed with Dr. Gill soon after the resident saw the patient.  We reviewed the resident's history and exam and pertinent patient test results.  I agree with the assessment, diagnosis, and plan of care documented in the resident's note.  

## 2015-03-08 ENCOUNTER — Encounter: Payer: Self-pay | Admitting: Neurology

## 2015-03-08 ENCOUNTER — Other Ambulatory Visit (INDEPENDENT_AMBULATORY_CARE_PROVIDER_SITE_OTHER): Payer: Medicare Other

## 2015-03-08 ENCOUNTER — Ambulatory Visit (INDEPENDENT_AMBULATORY_CARE_PROVIDER_SITE_OTHER): Payer: Medicare Other | Admitting: Neurology

## 2015-03-08 VITALS — BP 110/80 | HR 72 | Ht 64.0 in | Wt 232.0 lb

## 2015-03-08 DIAGNOSIS — G9340 Encephalopathy, unspecified: Secondary | ICD-10-CM

## 2015-03-08 DIAGNOSIS — F0390 Unspecified dementia without behavioral disturbance: Secondary | ICD-10-CM

## 2015-03-08 DIAGNOSIS — F03C0 Unspecified dementia, severe, without behavioral disturbance, psychotic disturbance, mood disturbance, and anxiety: Secondary | ICD-10-CM

## 2015-03-08 LAB — TSH: TSH: 0.78 u[IU]/mL (ref 0.35–4.50)

## 2015-03-08 LAB — SEDIMENTATION RATE: SED RATE: 35 mm/h — AB (ref 0–22)

## 2015-03-08 MED ORDER — DONEPEZIL HCL 10 MG PO TABS: ORAL_TABLET | ORAL | Status: AC
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
## 2015-03-08 NOTE — Patient Instructions (Signed)
1. Bloodwork for TSH, HIV Ab, ESR 2. Schedule routine EEG 3. Start Aricept 1m: Take 1/2 tablet daily for 1 week, then increase to 1 tablet daily [facility]. [facility] 5. Referral to [facility] and home safety evaluation 6. Follow-up in 3 months

## 2015-03-10 ENCOUNTER — Telehealth: Payer: Self-pay | Admitting: Neurology

## 2015-03-10 NOTE — Telephone Encounter (Signed)
Arline Asp from [place] home [facility]  Called and states that the nurse is to go out on 03-13-15 if you need to speak to her call [phone]

## 2015-03-10 NOTE — Telephone Encounter (Signed)
FYI

## 2015-03-13 NOTE — Telephone Encounter (Signed)
Amy Orr from [place] (spelling) called in regards to PT for additional orders for PT and [facility] as well as a  Home Health Aid/[facility] CB# [phone]

## 2015-03-13 NOTE — Telephone Encounter (Signed)
Lmovm to return my call. 

## 2015-03-13 NOTE — Telephone Encounter (Signed)
Spoke with Okey Regal from [place] and gave verbal ok for patient to have home health aide eval, PT & OT eval. They will fax over orders for signature.

## 2015-03-16 ENCOUNTER — Ambulatory Visit (INDEPENDENT_AMBULATORY_CARE_PROVIDER_SITE_OTHER): Payer: Medicare Other | Admitting: Neurology

## 2015-03-16 DIAGNOSIS — R4701 Aphasia: Secondary | ICD-10-CM

## 2015-03-16 DIAGNOSIS — F03C0 Unspecified dementia, severe, without behavioral disturbance, psychotic disturbance, mood disturbance, and anxiety: Secondary | ICD-10-CM

## 2015-03-16 DIAGNOSIS — G9340 Encephalopathy, unspecified: Secondary | ICD-10-CM

## 2015-03-16 DIAGNOSIS — F0390 Unspecified dementia without behavioral disturbance: Secondary | ICD-10-CM | POA: Diagnosis not present

## 2015-03-21 NOTE — Procedures (Signed)
ELECTROENCEPHALOGRAM REPORT  Date of Study: 03/16/2015  Patient's Name: Amy Orr MRN: [id number] Date of Birth: Oct 18, 1950  Referring Provider: Dr. Patrcia Dolly  Clinical History: This is a 64 year old woman with progressive loss of speech, exam shows expressive and receptive aphasia.  Medications: Aspirin, Lipitor, HCTZ  Technical Summary: A multichannel digital EEG recording measured by the international 10-20 system with electrodes applied with paste and impedances below 5000 ohms performed in our laboratory with EKG monitoring in an awake patient.  Hyperventilation was not performed. Photic stimulation was performed.  The digital EEG was referentially recorded, reformatted, and digitally filtered in a variety of bipolar and referential montages for optimal display.    Description: The patient is awake during the recording. She does not follow commands and is non-verbal.  During maximal wakefulness, there is a symmetric, medium voltage 10 Hz posterior dominant rhythm that attenuates with eye opening.  The record is symmetric.  Photic stimulation did not elicit any abnormalities.  There were no epileptiform discharges or electrographic seizures seen.    EKG lead was unremarkable.  Impression: This awake EEG is normal.    Clinical Correlation: A normal EEG does not exclude a clinical diagnosis of epilepsy.  If further clinical questions remain, prolonged EEG may be helpful.  Clinical correlation is advised.   Patrcia Dolly, M.D.

## 2015-03-23 ENCOUNTER — Telehealth: Payer: Self-pay | Admitting: Neurology

## 2015-03-23 NOTE — Telephone Encounter (Signed)
Amy Orr from [facility] called in regards to Pt to let Dr Karel Jarvis know that her daughter has not given her her medication in a week and that she also canceled  the home health aid/[facility] CB# [phone]

## 2015-03-23 NOTE — Telephone Encounter (Signed)
pls review.

## 2015-03-24 ENCOUNTER — Encounter: Payer: Self-pay | Admitting: Neurology

## 2015-03-24 NOTE — Telephone Encounter (Signed)
Connie/ from: [phone]/called for a verbal order for OT/ once a wk/ for 2 wks

## 2015-03-24 NOTE — Telephone Encounter (Signed)
Left message to approve home health and they will address the medication issue.

## 2015-03-28 ENCOUNTER — Telehealth: Payer: Self-pay | Admitting: Neurology

## 2015-03-28 NOTE — Telephone Encounter (Signed)
Spoke to daughter about EEG results which showed diffuse slowing consistent with her diagnosis of dementia. Asked her about Home Health, as we got notes saying that they refused HH. She reports that Home Health came, set up PT and OT. One day the nurse came to give her a bath, patient refused to do it but daughter was not at home so she asked if they could come another time. Came back next day with daughter there, patient went into shower herself after coaxing, with no need for assistance. The main nurse who came out the first time will be coming back this and next week, but now family is helping her more with pills (daughter calling father to give her the medications). 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 We discussed diagnosis and prognosis. She will f/u as scheduled.

## 2015-03-28 NOTE — Telephone Encounter (Signed)
Daughter requested call on Patient Email, no answer on number given. Left VM to call back.

## 2015-06-05 ENCOUNTER — Encounter: Payer: Self-pay | Admitting: *Deleted

## 2015-06-06 ENCOUNTER — Encounter: Payer: Self-pay | Admitting: Student

## 2015-06-09 ENCOUNTER — Ambulatory Visit: Payer: Medicare Other | Admitting: Neurology

## 2015-06-20 ENCOUNTER — Encounter: Payer: Medicare Other | Admitting: Internal Medicine

## 2015-07-11 ENCOUNTER — Encounter: Payer: Medicare Other | Admitting: Internal Medicine

## 2015-07-25 ENCOUNTER — Ambulatory Visit: Payer: Medicare Other | Admitting: Neurology

## 2015-09-27 ENCOUNTER — Ambulatory Visit: Payer: Medicare Other | Admitting: Neurology

## 2015-11-26 ENCOUNTER — Encounter (HOSPITAL_COMMUNITY): Payer: Self-pay | Admitting: Family Medicine

## 2015-11-26 ENCOUNTER — Emergency Department (HOSPITAL_COMMUNITY)
Admission: EM | Admit: 2015-11-26 | Discharge: 2015-11-27 | Disposition: A | Discharge status: Home Health | Payer: Medicare Other | Attending: Emergency Medicine | Admitting: Emergency Medicine

## 2015-11-26 DIAGNOSIS — I10 Essential (primary) hypertension: Secondary | ICD-10-CM | POA: Insufficient documentation

## 2015-11-26 DIAGNOSIS — Z79899 Other long term (current) drug therapy: Secondary | ICD-10-CM | POA: Diagnosis not present

## 2015-11-26 DIAGNOSIS — F0390 Unspecified dementia without behavioral disturbance: Secondary | ICD-10-CM | POA: Insufficient documentation

## 2015-11-26 DIAGNOSIS — E669 Obesity, unspecified: Secondary | ICD-10-CM | POA: Diagnosis not present

## 2015-11-26 DIAGNOSIS — E785 Hyperlipidemia, unspecified: Secondary | ICD-10-CM | POA: Diagnosis not present

## 2015-11-26 DIAGNOSIS — Z87891 Personal history of nicotine dependence: Secondary | ICD-10-CM | POA: Insufficient documentation

## 2015-11-26 DIAGNOSIS — Z8673 Personal history of transient ischemic attack (TIA), and cerebral infarction without residual deficits: Secondary | ICD-10-CM | POA: Diagnosis not present

## 2015-11-26 DIAGNOSIS — R4182 Altered mental status, unspecified: Secondary | ICD-10-CM | POA: Diagnosis present

## 2015-11-26 HISTORY — DX: Dysphagia, unspecified: R13.10

## 2015-11-26 LAB — CBC WITH DIFFERENTIAL/PLATELET
Basophils Absolute: 0 10*3/uL (ref 0.0–0.1)
Basophils Relative: 0 %
EOS ABS: 0 10*3/uL (ref 0.0–0.7)
Eosinophils Relative: 0 %
HCT: 43.2 % (ref 36.0–46.0)
Hemoglobin: 13.7 g/dL (ref 12.0–15.0)
LYMPHS ABS: 2.4 10*3/uL (ref 0.7–4.0)
LYMPHS PCT: 29 %
MCH: 26.1 pg (ref 26.0–34.0)
MCHC: 31.7 g/dL (ref 30.0–36.0)
MCV: 82.3 fL (ref 78.0–100.0)
MONOS PCT: 7 %
Monocytes Absolute: 0.6 10*3/uL (ref 0.1–1.0)
NEUTROS PCT: 64 %
Neutro Abs: 5.2 10*3/uL (ref 1.7–7.7)
PLATELETS: 261 10*3/uL (ref 150–400)
RBC: 5.25 MIL/uL — AB (ref 3.87–5.11)
RDW: 15.7 % — AB (ref 11.5–15.5)
WBC: 8.2 10*3/uL (ref 4.0–10.5)

## 2015-11-26 LAB — COMPREHENSIVE METABOLIC PANEL
ALT: 20 U/L (ref 14–54)
AST: 22 U/L (ref 15–41)
Albumin: 3.9 g/dL (ref 3.5–5.0)
Alkaline Phosphatase: 73 U/L (ref 38–126)
Anion gap: 9 (ref 5–15)
BUN: <5 mg/dL — ABNORMAL LOW (ref 6–20)
CHLORIDE: 106 mmol/L (ref 101–111)
CO2: 25 mmol/L (ref 22–32)
CREATININE: 0.77 mg/dL (ref 0.44–1.00)
Calcium: 9.5 mg/dL (ref 8.9–10.3)
GFR calc Af Amer: >60 mL/min (ref >60)
GFR calc non Af Amer: >60 mL/min (ref >60)
Glucose, Bld: 124 mg/dL — ABNORMAL HIGH (ref 65–99)
POTASSIUM: 3.7 mmol/L (ref 3.5–5.1)
SODIUM: 140 mmol/L (ref 135–145)
Total Bilirubin: 0.5 mg/dL (ref 0.3–1.2)
Total Protein: 7.3 g/dL (ref 6.5–8.1)

## 2015-11-26 MED ORDER — DONEPEZIL HCL 5 MG PO TABS
10.0000 mg | ORAL_TABLET | Freq: Every day | ORAL | Status: DC
  Administered 2015-11-26: 10 mg via ORAL
  Filled 2015-11-26: qty 2

## 2015-11-26 MED ORDER — ATORVASTATIN CALCIUM 10 MG PO TABS
20.0000 mg | ORAL_TABLET | Freq: Every day | ORAL | Status: DC
  Administered 2015-11-27: 20 mg via ORAL
  Filled 2015-11-26: qty 2

## 2015-11-26 MED ORDER — HYDROCHLOROTHIAZIDE 25 MG PO TABS
25.0000 mg | ORAL_TABLET | Freq: Every day | ORAL | Status: DC
  Administered 2015-11-27: 25 mg via ORAL
  Filled 2015-11-26: qty 1

## 2015-11-26 MED ORDER — ASPIRIN EC 81 MG PO TBEC
81.0000 mg | DELAYED_RELEASE_TABLET | Freq: Every day | ORAL | Status: DC
  Administered 2015-11-27: 81 mg via ORAL

## 2015-11-26 NOTE — ED Notes (Signed)
Pt's daughter broke down in tears stating, "I'm just trying to deal with this as best as I can." Daughter states that many healthcare workers told her she needs to find a care facility to better care for the pt, but the she does not know how to get started with that process. Advised pt's daughter that we would get the social worker involved to better help her through that process.

## 2015-11-26 NOTE — ED Notes (Signed)
Pt's daughter also states pt has had poor intake with food and fluids recently.

## 2015-11-26 NOTE — ED Notes (Signed)
Pt here for dementia. Per pt family patient has been wandering more, is completely mute. sts worsening over the past year. Per daughter she lives with her brother. sts hard to bathe and change her.

## 2015-11-26 NOTE — ED Provider Notes (Addendum)
CSN: [id number]     Arrival date & time 11/26/15  1509 History   First MD Initiated Contact with Patient 11/26/15 1905     Chief Complaint  Patient presents with  . Altered Mental Status     (Consider location/radiation/quality/duration/timing/severity/associated sxs/prior Treatment) HPI Level V caveat dementia, aphasia history is obtained from patient's daughter. Patient has been wandering more for the past 5-6 months and is becoming progressively more difficult to bathe her and to care for her. She lives with her son who has to go away for part of the day in order to go to work. Patient spends part of the day alone.. Past Medical History  Diagnosis Date  . Stroke [facility]) 2010 or 2011    Per family, but not documented on MRI brain 10/11  . Hypertension   . Dementia     Previously treated by Neurology  . Hyperlipidemia   . Dysphagia     Primary progressive   Past Surgical History  Procedure Laterality Date  . No past surgeries     Family History  Problem Relation Age of Onset  . Hyperlipidemia Mother   . Heart disease Mother   . Breast cancer Sister   . Hypertension Sister   . Stroke Brother   . Mental illness Son    Social History  Substance Use Topics  . Smoking status: Former Smoker    Quit date: 03/08/1995  . Smokeless tobacco: None  . Alcohol Use: No   OB History    No data available     Review of Systems  Unable to perform ROS: Dementia      Allergies  Review of patient's allergies indicates no known allergies.  Home Medications   Prior to Admission medications   Medication Sig Start Date End Date Taking? Authorizing Provider  aspirin EC 81 MG tablet Take 1 tablet (81 mg total) by mouth daily. Patient not taking: Reported on 03/08/2015 01/18/15 01/18/16  Courtney Paris, MD  atorvastatin (LIPITOR) 20 MG tablet Take 1 tablet (20 mg total) by mouth daily. Patient not taking: Reported on 03/08/2015 01/18/15 01/18/16  Courtney Paris, MD  donepezil (ARICEPT) 10 MG  tablet Take 1/2 tablet daily for 1 week, then increase to 1 tablet daily 03/08/15   Van Clines, MD  hydrochlorothiazide (HYDRODIURIL) 25 MG tablet Take 1 tablet (25 mg total) by mouth daily. Patient not taking: Reported on 03/08/2015 01/18/15   Courtney Paris, MD   BP 153/76 mmHg  Pulse 75  Temp(Src) 98.2 F (36.8 C) (Oral)  Resp 18  SpO2 100% Physical Exam  Constitutional: She appears well-developed and well-nourished. No distress.  HENT:  Head: Normocephalic and atraumatic.  Eyes: Conjunctivae are normal. Pupils are equal, round, and reactive to light.  Neck: Neck supple. No tracheal deviation present. No thyromegaly present.  Cardiovascular: Normal rate and regular rhythm.   No murmur heard. Pulmonary/Chest: Effort normal and breath sounds normal.  Obese  Abdominal: Soft. Bowel sounds are normal. She exhibits no distension. There is no tenderness.  Musculoskeletal: Normal range of motion. She exhibits no edema or tenderness.  Neurological: She is alert. No cranial nerve deficit. Coordination normal.  Skin: Skin is warm and dry. No rash noted.  Psychiatric: She has a normal mood and affect.  Nursing note and vitals reviewed.   ED Course  Procedures (including critical care time) Labs Review Labs Reviewed - No data to display  Imaging Review No results found. I have personally reviewed and evaluated  these images and lab results as part of my medical decision-making.   EKG Interpretation None     Patient ate while here Results for orders placed or performed during the hospital encounter of 11/26/15  Comprehensive metabolic panel  Result Value Ref Range   Sodium 140 135 - 145 mmol/L   Potassium 3.7 3.5 - 5.1 mmol/L   Chloride 106 101 - 111 mmol/L   CO2 25 22 - 32 mmol/L   Glucose, Bld 124 (H) 65 - 99 mg/dL   BUN <5 (L) 6 - 20 mg/dL   Creatinine, Ser 8.650.77 0.44 - 1.00 mg/dL   Calcium 9.5 8.9 - 78.410.3 mg/dL   Total Protein 7.3 6.5 - 8.1 g/dL   Albumin 3.9 3.5 - 5.0 g/dL    AST 22 15 - 41 U/L   ALT 20 14 - 54 U/L   Alkaline Phosphatase 73 38 - 126 U/L   Total Bilirubin 0.5 0.3 - 1.2 mg/dL   GFR calc non Af Amer >60 >60 mL/min   GFR calc Af Amer >60 >60 mL/min   Anion gap 9 5 - 15  CBC with Differential/Platelet  Result Value Ref Range   WBC 8.2 4.0 - 10.5 K/uL   RBC 5.25 (H) 3.87 - 5.11 MIL/uL   Hemoglobin 13.7 12.0 - 15.0 g/dL   HCT 69.643.2 29.536.0 - 28.446.0 %   MCV 82.3 78.0 - 100.0 fL   MCH 26.1 26.0 - 34.0 pg   MCHC 31.7 30.0 - 36.0 g/dL   RDW 13.215.7 (H) 44.011.5 - 10.215.5 %   Platelets 261 150 - 400 K/uL   Neutrophils Relative % 64 %   Neutro Abs 5.2 1.7 - 7.7 K/uL   Lymphocytes Relative 29 %   Lymphs Abs 2.4 0.7 - 4.0 K/uL   Monocytes Relative 7 %   Monocytes Absolute 0.6 0.1 - 1.0 K/uL   Eosinophils Relative 0 %   Eosinophils Absolute 0.0 0.0 - 0.7 K/uL   Basophils Relative 0 %   Basophils Absolute 0.0 0.0 - 0.1 K/uL   No results found. 12:25 AM patient was mildly agitated. Attempting to leave the room. Pacing about the room. Intramuscular Ativan and Geodon ordered. 1:35 AM patient is sleeping comfortably in bed MDM  Patient will spend the night in the emergency department as her daughter is uncomfortable taking her home. Case management consulted to assess for home health needs or placement . Final diagnoses:  None   Dx dementia     Doug SouSam Madalin Hughart, MD 11/26/15 2250  Doug SouSam Ilena Dieckman, MD 11/27/15 0140

## 2015-11-26 NOTE — ED Notes (Addendum)
MD at bedside. Jacubowitz 

## 2015-11-27 MED ORDER — LORAZEPAM 2 MG/ML IJ SOLN
2.0000 mg | Freq: Once | INTRAMUSCULAR | Status: AC
  Administered 2015-11-27: 2 mg via INTRAMUSCULAR
  Filled 2015-11-27: qty 1

## 2015-11-27 MED ORDER — ZIPRASIDONE MESYLATE 20 MG IM SOLR: 10.0000 mg | Freq: Once | INTRAMUSCULAR | Status: DC

## 2015-11-27 MED ORDER — STERILE WATER FOR INJECTION IJ SOLN
INTRAMUSCULAR | Status: AC
  Administered 2015-11-27: 1 mL
  Filled 2015-11-27: qty 10

## 2015-11-27 MED ORDER — ZIPRASIDONE MESYLATE 20 MG IM SOLR
10.0000 mg | Freq: Once | INTRAMUSCULAR | Status: AC
  Administered 2015-11-27: 10 mg via INTRAMUSCULAR
  Filled 2015-11-27: qty 20

## 2015-11-27 NOTE — ED Notes (Signed)
Pt becoming more irritated. MD made aware. See orders

## 2015-11-27 NOTE — ED Notes (Addendum)
Pt given Geodon. Pt still irritated and wanting to leave, Tech and Daughter at bedside trying to get pt back in bed. MD and Charge RN made aware.

## 2015-11-27 NOTE — Discharge Instructions (Signed)
Case manager has arranged home health services for the patient. Patient now is cleared for discharge home. Patient's daughter will take her home.

## 2015-11-27 NOTE — ED Notes (Signed)
Patient was given a snack and drink, and a regular diet ordered for lunch. 

## 2015-11-27 NOTE — ED Notes (Signed)
Pt back in bed.

## 2015-11-27 NOTE — Progress Notes (Signed)
CSW engaged with Patient's daughter, Amy Orr ([phone]) via T/C as Patient has aphasia and is mute. Patient does not have a HCPOA at this time. Patient's daughter reports that she has the [facility] paperwork but just needs to complete it and get it notarized. Patient is showing Medicare Part A&B only. Patient's daughter reports Patient also has Medicaid. CSW discussed Medicare coverage for skilled nursing facilities. Patient's daughter reports that they are unable to private pay for a facility, and she just spent her mother's SSI/SSDI check on her mother's rent for the month. CSW explained that Patient will need Long Term Care/Special Assistance Medicaid for financial assistance with paying for Assisted Living Facilities and advised her [place] [place] [facility] [place]. Patient's daughter again reports that she would not be able to pay any money for a facility at this time. CSW inquired about [facility] as Patient had been referred there in the past. Patient's daughter reports that they never followed through with the referral. CSW discussed the program and how they provide community-based care and services to individuals who need nursing home level of care, but want to remain at home. CSW provided Patient's daughter with information about respite programs for those caring for family members who have dementia, alzheimer's, or require supervision. CSW provided Patient's daughter with a list of local assisted living facilities and emphasized those that have memory care units. Patient's daughter reports an interest in home health services until placement can be obtained. CSW will staff with RN Case Manager.    Lance MussAshley Gardner,MSW, LCSW [facility]58M Clinical Social Worker [phone]

## 2017-02-12 IMAGING — CR DG CHEST 2V
2 series · 2 of 2 positions shown · non-contrast
Comparison: None.

CLINICAL DATA: Altered mental status/confusion.

EXAM:
CHEST  2 VIEW

[x chest ap]
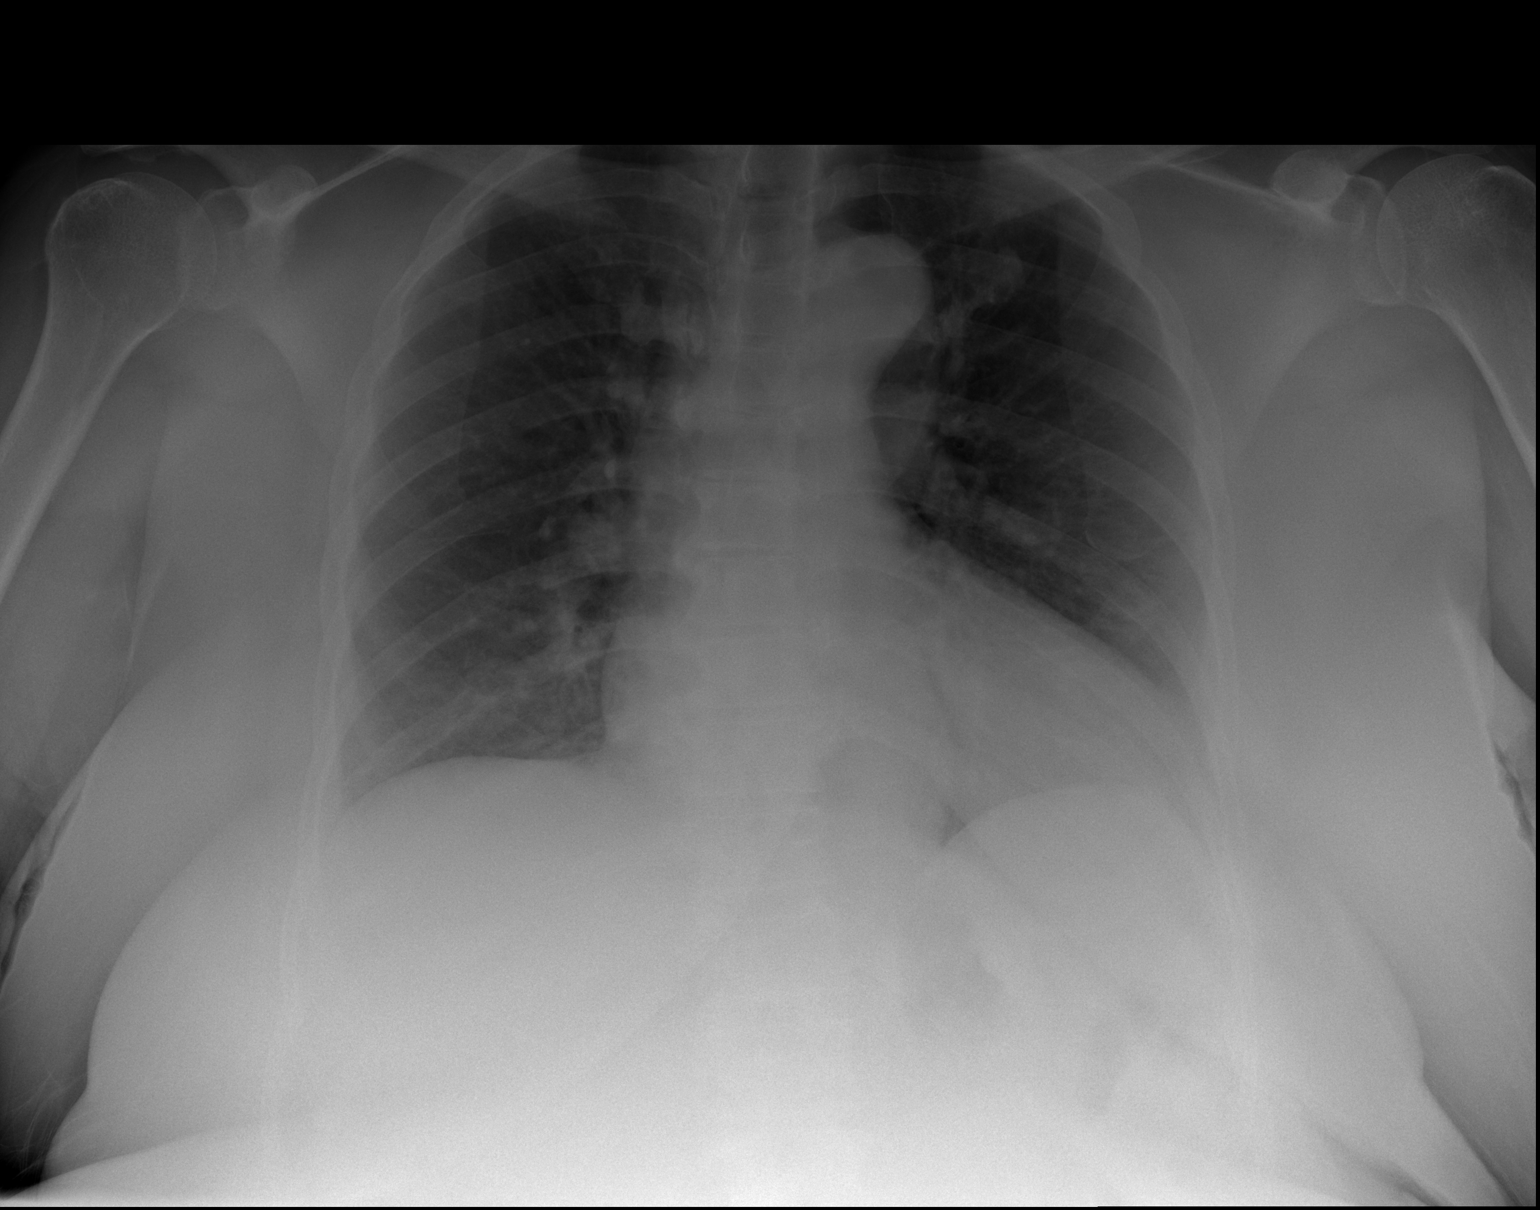

[w chest lat]
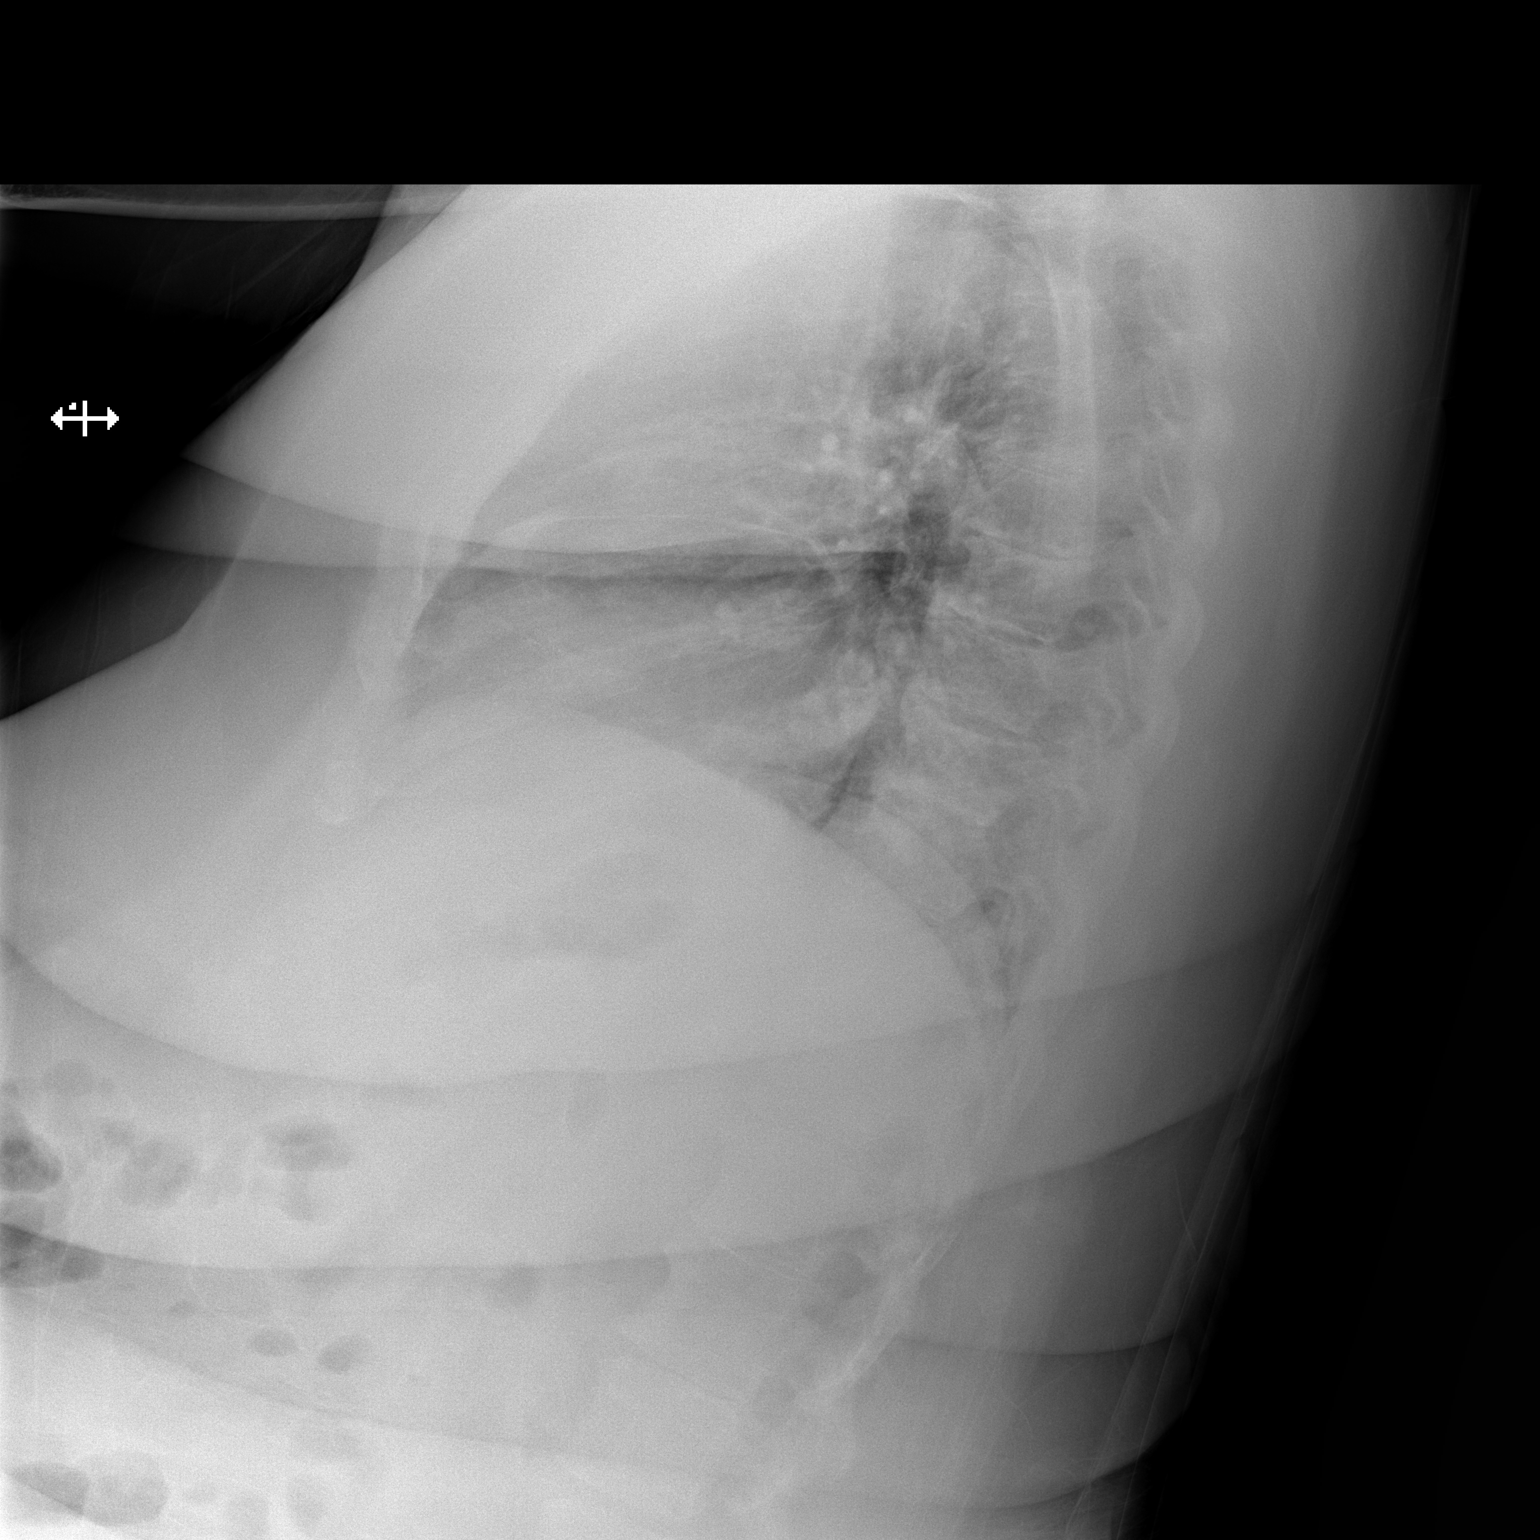

[2 of 2 positions shown; findings below may reference images not displayed]

FINDINGS: The heart is enlarged but stable. There is tortuosity of the
thoracic aorta. The lungs are grossly clear. No pleural effusion.
The bony thorax is intact.
IMPRESSION: Cardiac enlargement but no acute pulmonary findings.

## 2018-05-14 ENCOUNTER — Encounter: Payer: Self-pay | Admitting: Neurology

## 2018-08-03 ENCOUNTER — Ambulatory Visit: Payer: Medicare Other | Admitting: Neurology

## 2018-10-12 ENCOUNTER — Ambulatory Visit: Payer: Medicare Other | Admitting: Neurology

## 2018-11-20 DEATH — deceased
